# Patient Record
Sex: Female | Born: 1995 | Race: White | Hispanic: No | Marital: Single | Smoking: Never smoker
Health system: Southern US, Community
[De-identification: ages and names within clinical notes are randomized; demographics above are authoritative.]

## PROBLEM LIST (undated history)

## (undated) DIAGNOSIS — Z20828 Contact with and (suspected) exposure to other viral communicable diseases: Secondary | ICD-10-CM

---

## 2015-01-26 ENCOUNTER — Encounter: Payer: Self-pay | Admitting: Emergency Medicine

## 2015-01-26 ENCOUNTER — Emergency Department: Payer: 59

## 2015-01-26 ENCOUNTER — Emergency Department
Admission: EM | Admit: 2015-01-26 | Discharge: 2015-01-26 | Disposition: A | Discharge status: Home / Self Care | Payer: 59 | Attending: Emergency Medicine | Admitting: Emergency Medicine

## 2015-01-26 DIAGNOSIS — Z880 Allergy status to penicillin: Secondary | ICD-10-CM | POA: Insufficient documentation

## 2015-01-26 DIAGNOSIS — M5481 Occipital neuralgia: Secondary | ICD-10-CM | POA: Diagnosis not present

## 2015-01-26 DIAGNOSIS — R519 Headache, unspecified: Secondary | ICD-10-CM

## 2015-01-26 DIAGNOSIS — R51 Headache: Secondary | ICD-10-CM

## 2015-01-26 MED ORDER — METOCLOPRAMIDE HCL 5 MG/ML IJ SOLN
INTRAMUSCULAR | Status: AC
  Filled 2015-01-26: qty 2

## 2015-01-26 MED ORDER — PROMETHAZINE HCL 25 MG PO TABS: 25.0000 mg | ORAL_TABLET | Freq: Three times a day (TID) | ORAL | Status: AC | PRN

## 2015-01-26 MED ORDER — METOCLOPRAMIDE HCL 5 MG/ML IJ SOLN
10.0000 mg | Freq: Once | INTRAMUSCULAR | Status: AC
  Administered 2015-01-26: 10 mg via INTRAVENOUS
  Filled 2015-01-26: qty 2

## 2015-01-26 MED ORDER — KETOROLAC TROMETHAMINE 30 MG/ML IJ SOLN
30.0000 mg | Freq: Once | INTRAMUSCULAR | Status: AC
  Administered 2015-01-26: 30 mg via INTRAVENOUS

## 2015-01-26 MED ORDER — KETOROLAC TROMETHAMINE 30 MG/ML IJ SOLN
INTRAMUSCULAR | Status: AC
  Filled 2015-01-26: qty 1

## 2015-01-26 MED ORDER — SODIUM CHLORIDE 0.9 % IV BOLUS (SEPSIS)
1000.0000 mL | Freq: Once | INTRAVENOUS | Status: AC
  Administered 2015-01-26: 1000 mL via INTRAVENOUS

## 2015-01-26 NOTE — ED Provider Notes (Signed)
[facility] Emergency Department Provider Note REMINDER - THIS NOTE IS NOT A FINAL MEDICAL RECORD UNTIL IT IS SIGNED. UNTIL THEN, THE CONTENT BELOW MAY REFLECT INFORMATION FROM A DOCUMENTATION TEMPLATE, NOT THE ACTUAL PATIENT VISIT. ____________________________________________  Time seen: Approximately 5:35 PM  I have reviewed the triage vital signs and the nursing notes.   HISTORY  Chief Complaint Headache    HPI Patricia Miranda is a 19 y.o. female with no significant medical history.  Patient reports for about the last 4 days she's been having a moderate headache, generally worse when she is up and moving, and also notices it more when she is trying to focus her pain attention during class. She reports it is causing her some nausea. The headache is primarily located at the base of the skull and radiates up over the back of the scalp on each side. Described as a throbbing sensation. Light does make the headache slightly worse, but not significantly worse.  No vomiting. She has not had any fever. She denies neck pain, but does have sensation that the pain radiates out from the back of the base of the skull up. No injuries or trauma.  She does report that she had a CAT scan of her head when she was in junior high, but there is no notation of it being abnormal at that time. She did go to urgent care yesterday and her headache improved slightly with IM Toradol, but it still persists today.  No numbness or tingling. No weakness. The headache was mild in onset, and has seemed to persistently worsened or didn't improve yesterday but then returned again this morning.  Family history migraines in both her mother and father. No history of aneurysms in the family. She has had her meningitis vaccine.  History reviewed. No pertinent past medical history.  There are no active problems to display for this patient.   History reviewed. No pertinent past surgical  history.  Current Outpatient Rx  Name  Route  Sig  Dispense  Refill  . promethazine (PHENERGAN) 25 MG tablet   Oral   Take 1 tablet (25 mg total) by mouth every 8 (eight) hours as needed for nausea or vomiting.   30 tablet   0     Allergies Amoxicillin  No family history on file.  Social History Social History  Substance Use Topics  . Smoking status: Never Smoker   . Smokeless tobacco: None  . Alcohol Use: Yes    Review of Systems Constitutional: No fever/chills Eyes: No visual changes. ENT: No sore throat. Cardiovascular: Denies chest pain. Respiratory: Denies shortness of breath. Gastrointestinal: No abdominal pain.  No nausea, no vomiting.  No diarrhea.  No constipation. Genitourinary: Negative for dysuria. Musculoskeletal: Negative for back pain. Skin: Negative for rash. Neurological: Negative forfocal weakness or numbness.  Denies pregnancy.  No tick bites.  10-point ROS otherwise negative.  ____________________________________________   PHYSICAL EXAM:  VITAL SIGNS: ED Triage Vitals  Enc Vitals Group     BP 01/26/15 1505 113/87 mmHg     Pulse Rate 01/26/15 1505 97     Resp 01/26/15 1505 20     Temp 01/26/15 1505 99 F (37.2 C)     Temp Source 01/26/15 1505 Oral     SpO2 01/26/15 1505 99 %     Weight 01/26/15 1505 134 lb (60.782 kg)     Height 01/26/15 1505  (1.702 m)     Head Cir --  Peak Flow --      Pain Score 01/26/15 1508 8     Pain Loc --      Pain Edu? --      Excl. in GC? --    Constitutional: Alert and oriented. Well appearing and in no acute distress. Eyes: Conjunctivae are normal. PERRL. EOMI. Head: Atraumatic. Nose: No congestion/rhinnorhea. Mouth/Throat: Mucous membranes are moist.  Oropharynx non-erythematous. Neck: No stridor.  No meningismus. Midline no cervical spine tenderness. Jolt accentuation testing is normal. No temporal artery tenderness. Normal temporal artery pulsation bilateral Cardiovascular: Normal  rate, regular rhythm. Grossly normal heart sounds.  Good peripheral circulation. Respiratory: Normal respiratory effort.  No retractions. Lungs CTAB. Gastrointestinal: Soft and nontender. No distention. No abdominal bruits. No CVA tenderness. Musculoskeletal: No lower extremity tenderness nor edema.  No joint effusions. Neurologic:  Normal speech and language. No gross focal neurologic deficits are appreciated.   The patient has no pronator drift. The patient has normal cranial nerve exam. Extraocular movements are normal. Visual fields are normal. Patient has 5 out of 5 strength in all extremities. There is no numbness or gross, acute sensory abnormality in the extremities bilaterally. No speech disturbance. No dysarthria. No aphasia. No ataxia. Patient speaking in full and clear sentences.   Skin:  Skin is warm, dry and intact. No rash noted. Psychiatric: Mood and affect are normal. Speech and behavior are normal.  ____________________________________________   LABS (all labs ordered are listed, but only abnormal results are displayed)  Labs Reviewed - No data to display ____________________________________________  EKG   ____________________________________________  RADIOLOGY  CT Head Wo Contrast (Final result) Result time: 01/26/15 17:56:19   Final result by Rad Results In Interface (01/26/15 17:56:19)   Narrative:   CLINICAL DATA: Headache for 4 days. Worse when reading and trying to focus.  EXAM: CT HEAD WITHOUT CONTRAST  TECHNIQUE: Contiguous axial images were obtained from the base of the skull through the vertex without contrast.  COMPARISON: None  FINDINGS: Normal appearance of the intracranial structures. No evidence for acute hemorrhage, mass lesion, midline shift, hydrocephalus or large infarct. No acute bony abnormality. The visualized sinuses are clear.  IMPRESSION: Negative head CT.     ____________________________________________   PROCEDURES  Procedure(s) performed: None  Critical Care performed: No  ____________________________________________   INITIAL IMPRESSION / ASSESSMENT AND PLAN / ED COURSE  Pertinent labs & imaging results that were available during my care of the patient were reviewed by me and considered in my medical decision making (see chart for details).  Headache, rather insidious onset while at the fair. Has come and gone, but slightly worse for last 2 days. Did improve after being seen in urgent care, but is recurred. At this point she is afebrile, she has no infectious symptoms and no meningismus. Jolt accentuation is negative him and I find the likelihood of meningitis to be extremely unlikely. We did discuss the risks and benefits of spinal tap, and after discussion of the risks and benefits we will not provide a spinal tap. I am in agreement, I think at this point that the risk of Location from spinal tap and the possibility of actually worsening or headache is higher than the likelihood of meningitis based on critical history and exam. Certainly, I discussed with she develops a fever or neck pain or stiffness or worsening of her headache she will need to return to emergency room right away, and she is quite agreeable.  No history suggesting aneurysm. Based on radiation of  pain I suspect this may be due to an occipital type headache, migraine, or stress headache. No evidence of neurologic abnormality. We will obtain CT head, as the patient has not had any imaging in recent years and we do wish to exclude any large mass lesion.  ----------------------------------------- 7:19 PM on 01/26/2015 -----------------------------------------  Patient awake alert no distress. States her headache is completely resolved. She is well-appearing, and I discussed care for return precautions especially if she develop any fever or neck stiffness she'll return  emergency room right away. At this point, I suspect this is occipital headache versus possible migraine or strong family history. No symptomatology to support diagnosis of aneurysm, temporal arteritis, meningitis.  Discharge home, much improved.  ____________________________________________   FINAL CLINICAL IMPRESSION(S) / ED DIAGNOSES  Final diagnoses:  Headache, occipital      Sharyn Creamer, MD 01/26/15 1920

## 2015-01-26 NOTE — ED Notes (Signed)
Pt with headache for four days worse when reading and tyring to focus to see. With some nausea.

## 2015-01-26 NOTE — Discharge Instructions (Signed)
You have been seen in the Emergency Department (ED) for a headache.  Please use Tylenol or Motrin as needed for symptoms, but only as written on the box.  As we have discussed, please follow up with your primary care doctor as soon as possible regarding todays Emergency Department (ED) visit and your headache symptoms.    Call your doctor or return to the ED if you have any FEVER or stiff neck, worsening headache, sudden and severe headache, confusion, slurred speech, facial droop, weakness or numbness in any arm or leg, extreme fatigue, vision problems, or other symptoms that concern you.   General Headache Without Cause A headache is pain or discomfort felt around the head or neck area. The specific cause of a headache may not be found. There are many causes and types of headaches. A few common ones are:  Tension headaches.  Migraine headaches.  Cluster headaches.  Chronic daily headaches. HOME CARE INSTRUCTIONS  Watch your condition for any changes. Take these steps to help with your condition: Managing Pain  Take over-the-counter and prescription medicines only as told by your health care provider.  Lie down in a dark, quiet room when you have a headache.  If directed, apply ice to the head and neck area:  Put ice in a plastic bag.  Place a towel between your skin and the bag.  Leave the ice on for 20 minutes, 2-3 times per day.  Use a heating pad or hot shower to apply heat to the head and neck area as told by your health care provider.  Keep lights dim if bright lights bother you or make your headaches worse. Eating and Drinking  Eat meals on a regular schedule.  Limit alcohol use.  Decrease the amount of caffeine you drink, or stop drinking caffeine. General Instructions  Keep all follow-up visits as told by your health care provider. This is important.  Keep a headache journal to help find out what may trigger your headaches. For example, write down:  What you  eat and drink.  How much sleep you get.  Any change to your diet or medicines.  Try massage or other relaxation techniques.  Limit stress.  Sit up straight, and do not tense your muscles.  Do not use tobacco products, including cigarettes, chewing tobacco, or e-cigarettes. If you need help quitting, ask your health care provider.  Exercise regularly as told by your health care provider.  Sleep on a regular schedule. Get 7-9 hours of sleep, or the amount recommended by your health care provider. SEEK MEDICAL CARE IF:   Your symptoms are not helped by medicine.  You have a headache that is different from the usual headache.  You have nausea or you vomit.  You have a fever. SEEK IMMEDIATE MEDICAL CARE IF:   Your headache becomes severe.  You have repeated vomiting.  You have a stiff neck.  You have a loss of vision.  You have problems with speech.  You have pain in the eye or ear.  You have muscular weakness or loss of muscle control.  You lose your balance or have trouble walking.  You feel faint or pass out.  You have confusion.   This information is not intended to replace advice given to you by your health care provider. Make sure you discuss any questions you have with your health care provider.   Document Released: 03/18/2005 Document Revised: 12/07/2014 Document Reviewed: 07/11/2014 Elsevier Interactive Patient Education [employer]2016 [employer]

## 2015-01-28 ENCOUNTER — Emergency Department: Payer: 59

## 2015-01-28 ENCOUNTER — Encounter: Payer: Self-pay | Admitting: Emergency Medicine

## 2015-01-28 ENCOUNTER — Emergency Department
Admission: EM | Admit: 2015-01-28 | Discharge: 2015-01-28 | Disposition: A | Discharge status: Home / Self Care | Payer: 59 | Attending: Emergency Medicine | Admitting: Emergency Medicine

## 2015-01-28 DIAGNOSIS — R519 Headache, unspecified: Secondary | ICD-10-CM

## 2015-01-28 DIAGNOSIS — R509 Fever, unspecified: Secondary | ICD-10-CM | POA: Insufficient documentation

## 2015-01-28 DIAGNOSIS — R51 Headache: Secondary | ICD-10-CM | POA: Insufficient documentation

## 2015-01-28 DIAGNOSIS — Z880 Allergy status to penicillin: Secondary | ICD-10-CM | POA: Diagnosis not present

## 2015-01-28 HISTORY — DX: Contact with and (suspected) exposure to other viral communicable diseases: Z20.828

## 2015-01-28 LAB — COMPREHENSIVE METABOLIC PANEL
ALBUMIN: 4.3 g/dL (ref 3.5–5.0)
ALK PHOS: 46 U/L (ref 38–126)
ALT: 63 U/L — ABNORMAL HIGH (ref 14–54)
ANION GAP: 4 — AB (ref 5–15)
AST: 72 U/L — ABNORMAL HIGH (ref 15–41)
BILIRUBIN TOTAL: 0.8 mg/dL (ref 0.3–1.2)
BUN: 10 mg/dL (ref 6–20)
CALCIUM: 9.3 mg/dL (ref 8.9–10.3)
CO2: 28 mmol/L (ref 22–32)
Chloride: 106 mmol/L (ref 101–111)
Creatinine, Ser: 0.82 mg/dL (ref 0.44–1.00)
GFR calc Af Amer: >60 mL/min (ref >60)
GLUCOSE: 87 mg/dL (ref 65–99)
Potassium: 3.9 mmol/L (ref 3.5–5.1)
Sodium: 138 mmol/L (ref 135–145)
TOTAL PROTEIN: 7.5 g/dL (ref 6.5–8.1)

## 2015-01-28 LAB — CBC WITH DIFFERENTIAL/PLATELET
BASOS PCT: 0 %
Basophils Absolute: 0 10*3/uL (ref 0–0.1)
Eosinophils Absolute: 0.1 10*3/uL (ref 0–0.7)
Eosinophils Relative: 1 %
HEMATOCRIT: 44 % (ref 35.0–47.0)
HEMOGLOBIN: 14.6 g/dL (ref 12.0–16.0)
LYMPHS ABS: 1.3 10*3/uL (ref 1.0–3.6)
LYMPHS PCT: 16 %
MCH: 29.5 pg (ref 26.0–34.0)
MCHC: 33.2 g/dL (ref 32.0–36.0)
MCV: 89.1 fL (ref 80.0–100.0)
MONO ABS: 0.5 10*3/uL (ref 0.2–0.9)
MONOS PCT: 7 %
NEUTROS ABS: 6.1 10*3/uL (ref 1.4–6.5)
NEUTROS PCT: 76 %
Platelets: 158 10*3/uL (ref 150–440)
RBC: 4.94 MIL/uL (ref 3.80–5.20)
RDW: 12.8 % (ref 11.5–14.5)
WBC: 8 10*3/uL (ref 3.6–11.0)

## 2015-01-28 MED ORDER — DIAZEPAM 2 MG PO TABS
2.0000 mg | ORAL_TABLET | Freq: Four times a day (QID) | ORAL | Status: AC | PRN
Start: 2015-01-28 — End: 2016-01-28

## 2015-01-28 MED ORDER — DIAZEPAM 2 MG PO TABS
2.0000 mg | ORAL_TABLET | Freq: Once | ORAL | Status: AC
  Administered 2015-01-28: 2 mg via ORAL
  Filled 2015-01-28: qty 1

## 2015-01-28 MED ORDER — KETOROLAC TROMETHAMINE 30 MG/ML IJ SOLN
30.0000 mg | Freq: Once | INTRAMUSCULAR | Status: AC
  Administered 2015-01-28: 30 mg via INTRAVENOUS
  Filled 2015-01-28: qty 1

## 2015-01-28 MED ORDER — GADOBENATE DIMEGLUMINE 529 MG/ML IV SOLN
15.0000 mL | Freq: Once | INTRAVENOUS | Status: AC | PRN
  Administered 2015-01-28: 12 mL via INTRAVENOUS

## 2015-01-28 MED ORDER — DEXAMETHASONE 1 MG/ML PO CONC: 10.0000 mg | Freq: Once | ORAL | Status: DC

## 2015-01-28 MED ORDER — MAGNESIUM SULFATE 2 GM/50ML IV SOLN
2.0000 g | Freq: Once | INTRAVENOUS | Status: AC
Start: 2015-01-28 — End: 2015-01-28
  Administered 2015-01-28: 2 g via INTRAVENOUS
  Filled 2015-01-28: qty 50

## 2015-01-28 MED ORDER — PROCHLORPERAZINE EDISYLATE 5 MG/ML IJ SOLN
10.0000 mg | Freq: Four times a day (QID) | INTRAMUSCULAR | Status: DC | PRN
  Administered 2015-01-28: 10 mg via INTRAVENOUS
  Filled 2015-01-28: qty 2

## 2015-01-28 MED ORDER — DEXAMETHASONE SODIUM PHOSPHATE 10 MG/ML IJ SOLN
10.0000 mg | Freq: Once | INTRAMUSCULAR | Status: AC
  Administered 2015-01-28: 10 mg via INTRAVENOUS
  Filled 2015-01-28: qty 1

## 2015-01-28 MED ORDER — SODIUM CHLORIDE 0.9 % IV BOLUS (SEPSIS)
1000.0000 mL | Freq: Once | INTRAVENOUS | Status: AC
  Administered 2015-01-28: 1000 mL via INTRAVENOUS

## 2015-01-28 NOTE — Discharge Instructions (Signed)
No certain cause was found for your headache, however your exam and evaluation are reassuring here in the emergency department today. We discussed you may use over-the-counter ibuprofen 600 mg every 8 hours as needed for headache. This can be combined with your Phenergan prescription.  I am adding a new prescription called Valium which is a muscle relaxer, and may help if tension is component of this headache. Do not combine with other sedating medications including Phenergan, Benadryl, or alcohol.  We discussed magnesium supplementation.  Slow-release preparation such as Slow-Mag or Mag64 would be best.  1-2 tablets twice daily until headache resolved.  If no slow release preparation, you may use Magnesium Oxide which is an immediate release preparation,  up to every 6 hours.  Return to the emergency department for any worsening condition including worsening headache, any confusion or altered mental status, new weakness or numbness, seizure activity, neck stiffness, fever, skin rash, or any other symptoms that are concerning to you.   General Headache Without Cause A headache is pain or discomfort felt around the head or neck area. There are many causes and types of headaches. In some cases, the cause may not be found.  HOME CARE  Managing Pain  Take over-the-counter and prescription medicines only as told by your doctor.  Lie down in a dark, quiet room when you have a headache.  If directed, apply ice to the head and neck area:  Put ice in a plastic bag.  Place a towel between your skin and the bag.  Leave the ice on for 20 minutes, 2-3 times per day.  Use a heating pad or hot shower to apply heat to the head and neck area as told by your doctor.  Keep lights dim if bright lights bother you or make your headaches worse. Eating and Drinking  Eat meals on a regular schedule.  Lessen how much alcohol you drink.  Lessen how much caffeine you drink, or stop drinking  caffeine. General Instructions  Keep all follow-up visits as told by your doctor. This is important.  Keep a journal to find out if certain things bring on headaches. For example, write down:  What you eat and drink.  How much sleep you get.  Any change to your diet or medicines.  Relax by getting a massage or doing other relaxing activities.  Lessen stress.  Sit up straight. Do not tighten (tense) your muscles.  Do not use tobacco products. This includes cigarettes, chewing tobacco, or e-cigarettes. If you need help quitting, ask your doctor.  Exercise regularly as told by your doctor.  Get enough sleep. This often means 7-9 hours of sleep. GET HELP IF:  Your symptoms are not helped by medicine.  You have a headache that feels different than the other headaches.  You feel sick to your stomach (nauseous) or you throw up (vomit).  You have a fever. GET HELP RIGHT AWAY IF:   Your headache becomes really bad.  You keep throwing up.  You have a stiff neck.  You have trouble seeing.  You have trouble speaking.  You have pain in the eye or ear.  Your muscles are weak or you lose muscle control.  You lose your balance or have trouble walking.  You feel like you will pass out (faint) or you pass out.  You have confusion.   This information is not intended to replace advice given to you by your health care provider. Make sure you discuss any questions you have  with your health care provider.   Document Released: 12/26/2007 Document Revised: 12/07/2014 Document Reviewed: 07/11/2014 Elsevier Interactive Patient Education [employer] Elsevier Inc.

## 2015-01-28 NOTE — ED Notes (Signed)
Patient transported to MRI. NT with patient.

## 2015-01-28 NOTE — ED Provider Notes (Signed)
[facility]  I accepted care from Dr. Derrill KayGoodman ____________________________________________    LABS (pertinent positives/negatives)  CBC within normal limits. Normal white blood cell count with no left shift. Normal hemoglobin. White count 158 Con transit metabolic panel significant for AST 72 and a LT 63 and otherwise all within normal limits   ____________________________________________    RADIOLOGY All xrays were viewed by me. Imaging interpreted by radiologist.  MRI and MRV brain:  FINDINGS: The patient was unable to remain motionless for the exam. Small or subtle lesions could be overlooked. MRI HEAD FINDINGS  No evidence for acute infarction, hemorrhage, mass lesion, hydrocephalus, or extra-axial fluid. Normal cerebral volume. No white matter disease. No midline abnormality. No osseous findings. Post infusion, no abnormal enhancement of the brain or meninges. Moderate nasopharyngeal adenoidal hypertrophy, otherwise unremarkable extracranial soft tissues.  MRA HEAD FINDINGS   FINDINGS: The patient was unable to remain motionless for the exam. Small or subtle lesions could be overlooked. MRI HEAD FINDINGS  No evidence for acute infarction, hemorrhage, mass lesion, hydrocephalus, or extra-axial fluid. Normal cerebral volume. No white matter disease. No midline abnormality. No osseous findings. Post infusion, no abnormal enhancement of the brain or meninges. Moderate nasopharyngeal adenoidal hypertrophy, otherwise unremarkable extracranial soft tissues.  MRA HEAD FINDINGS  The internal carotid arteries are widely patent. The basilar artery is widely patent. Both vertebrals contribute to basilar formation, LEFT dominant. No intracranial stenosis or aneurysm.  MRV head FINDINGS  All major dural venous sinuses are patent. Symmetric venous flow. Within limits of detection on MRV, no cortical venous thrombosis.  IMPRESSION: Negative  exam.   ____________________________________________   PROCEDURES  Procedure(s) performed: None  Critical Care performed: None  ____________________________________________   INITIAL IMPRESSION / ASSESSMENT AND PLAN / ED COURSE  CONSULTATIONS: None  Pertinent labs & imaging results that were available during my care of the patient were reviewed by me and considered in my medical decision making (see chart for details).  The patient and her mother filled me in on the fact that she's had a headache now since Monday, with really minimal relief despite treatment here in the emergency department previously and today. She is currently still having a headache of 7 out of 10. She does have some tenderness on palpation of the trapezius muscles bilaterally and at the insertion at the base of the skull. There is no neck stiffness. She is not really had any other viral symptoms, but viral meningitis is still on the differential diagnosis.  Clinically I do not think she has bacterial meningitis. I discussed lumbar puncture with mom and patient, and they did not want to proceed with lumbar puncture.  Her MRI/MRA/MRV are negative and I discussed this result with the patient and her mom.  I am adding Toradol today for persistent headache, and they stated this has not worked in the past. I am adding magnesium IV. I'm also adding a dose of Decadron as well as a by mouth tablet of Valium given the muscular discomfort of the trapezius.  We discussed environmental factors, it does not appear that she has been exposed to any chemicals, or mold.  They were given follow-up information for Dr. Malvin JohnsPotter, neurology on Thursday, and had not made an appointment yet. They will try again to call the office on Monday. Mom is also going to check with other neurology practice is where she can try to get her daughter in the quickest.  At 7:10 PM, patient was feeling somewhat improved, but  lying flat. I discussed with  them admission for intractable headache versus discharge home. I don't see an acute indication or need for hospitalization, and they did express interest in trying to go home. We discussed returning for any worsening condition.  Patient / Family / Caregiver informed of clinical course, medical decision-making process, and agree with plan.   I discussed return precautions, follow-up instructions, and discharged instructions with patient and/or family.  ____________________________________________   FINAL CLINICAL IMPRESSION(S) / ED DIAGNOSES  Final diagnoses:  Headache     FOLLOW UP   Referred to: Dr. Malvin Johns, neurology. [facility] for primary care  Governor Rooks, MD 01/28/15 1911

## 2015-01-28 NOTE — ED Provider Notes (Signed)
[facility] Emergency Department Provider Note    ____________________________________________  Time seen: 1300  I have reviewed the triage vital signs and the nursing notes.   HISTORY  Chief Complaint Headache and Fever   History limited by: Not Limited   HPI Patricia Shutterslexandra Wyre is a 19 y.o. female who presents to the emergency department today with continued headache. The patient states that this headache is now been going on for almost one week. The headache is located bilaterally and extends from her neck into her frontal region. The patient states the headache is worse when she sits up. She denies having similar headaches in the past. She has been seen now twice for this headache and with only temporary relief with headache cocktails given by providers. She states she is having low-grade fever. She denies being on any birth control or smoking. No history of blood clots in the family.   Past Medical History  Diagnosis Date  . 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   History reviewed. No pertinent past surgical history.  Current Outpatient Rx  Name  Route  Sig  Dispense  Refill  . promethazine (PHENERGAN) 25 MG tablet   Oral   Take 1 tablet (25 mg total) by mouth every 8 (eight) hours as needed for nausea or vomiting.   30 tablet   0     Allergies Amoxicillin  No family history on file.  Social History Social History  Substance Use Topics  . Smoking status: Never Smoker   . Smokeless tobacco: None  . Alcohol Use: Yes     Comment: occas.     Review of Systems  Constitutional: Negative for fever. Cardiovascular: Negative for chest pain. Respiratory: Negative for shortness of breath. Gastrointestinal: Negative for abdominal pain, vomiting and diarrhea. Genitourinary: Negative for dysuria. Musculoskeletal: Negative for back pain. Skin: Negative for rash. Neurological: Positive for  headache   10-point ROS otherwise negative.  ____________________________________________   PHYSICAL EXAM:  VITAL SIGNS: ED Triage Vitals  Enc Vitals Group     BP 01/28/15 1149 110/80 mmHg     Pulse Rate 01/28/15 1149 81     Resp 01/28/15 1149 18     Temp 01/28/15 1149 98.4 F (36.9 C)     Temp Source 01/28/15 1149 Oral     SpO2 01/28/15 1149 100 %     Weight 01/28/15 1149 134 lb (60.782 kg)     Height 01/28/15 1149 5\' 7"  (1.702 m)     Head Cir --      Peak Flow --      Pain Score 01/28/15 1150 7   Constitutional: Alert and oriented. Well appearing and in no distress lying in the bed however upon sitting up she appears more uncomfortable. Eyes: Conjunctivae are normal. PERRL. Normal extraocular movements. ENT   Head: Normocephalic and atraumatic.   Nose: No congestion/rhinnorhea.   Mouth/Throat: Mucous membranes are moist.   Neck: No stridor. Hematological/Lymphatic/Immunilogical: No cervical lymphadenopathy. Cardiovascular: Normal rate, regular rhythm.  No murmurs, rubs, or gallops. Respiratory: Normal respiratory effort without tachypnea nor retractions. Breath sounds are clear and equal bilaterally. No wheezes/rales/rhonchi. Gastrointestinal: Soft and nontender. No distention. There is no CVA tenderness. Genitourinary: Deferred Musculoskeletal: Normal range of motion in all extremities. No joint effusions.  No lower extremity tenderness nor edema. Neurologic:  Normal speech and language. No gross focal neurologic deficits are appreciated. Speech is normal.  Skin:  Skin is warm,  dry and intact. No rash noted. Psychiatric: Mood and affect are normal. Speech and behavior are normal. Patient exhibits appropriate insight and judgment.  ____________________________________________    LABS (pertinent positives/negatives)  Labs Reviewed  COMPREHENSIVE METABOLIC PANEL - Abnormal; Notable for the following:    AST 72 (*)    ALT 63 (*)    Anion gap 4 (*)     All other components within normal limits  CBC WITH DIFFERENTIAL/PLATELET     ____________________________________________   EKG  None  ____________________________________________    RADIOLOGY  MRI pending   ____________________________________________   PROCEDURES  Procedure(s) performed: None  Critical Care performed: No  ____________________________________________   INITIAL IMPRESSION / ASSESSMENT AND PLAN / ED COURSE  Pertinent labs & imaging results that were available during my care of the patient were reviewed by me and considered in my medical decision making (see chart for details).  Patient presents to the emergency department today with concerns for continued headache. She has been seen now 2 times previously. She did have a negative CT scan. On exam patient without any signs of meningitis. Full range and painless motion of the neck. At this point I doubt bacterial meningitis given clinical findings as well as lack of fever. Given the patient has persistent headache will get an MRI including MRV to rule out venous sinus thrombosis as well as any other concerning etiology. Will give medication.  ____________________________________________   FINAL CLINICAL IMPRESSION(S) / ED DIAGNOSES  Final diagnoses:  Headache     Phineas Semen, MD 01/29/15 [phone]

## 2015-01-28 NOTE — ED Notes (Signed)
Patient returned from MRI.

## 2015-01-28 NOTE — ED Notes (Signed)
Pt reports since Thursday fever, light sensitivity, stiffness in neck and pain in upper back and shoulder. Pt also reports headache since Monday. Pt reports 99.4 temp taken in ear. Last night

## 2017-04-02 IMAGING — MR MR MRA HEAD W/O CM
1 series · 26 of 48 positions shown · IV contrast (multihance)
Comparison: CT head 01/26/2015

CLINICAL DATA: Headache for 4 days.  Neck stiffness.  Fever.

EXAM:
MRI HEAD WITHOUT AND WITH CONTRAST AND MRA HEAD WITHOUT CONTRAST AND
MRV HEAD WITHOUT CONTRAST
TECHNIQUE: Multiplanar, multiecho pulse sequences of the brain and surrounding
structures were obtained without and with intravenous contrast.
Angiographic images of the head were obtained using MRA technique
without contrast. Multiplanar, multiecho pulse sequences of the
venous sinuses were obtained without contrast
CONTRAST:  12mL MULTIHANCE GADOBENATE DIMEGLUMINE 529 MG/ML IV SOLN

[Series 3: TOF · axial · 0.8mm · 0.39mm/px · z∈[-66,+24]mm · 26 of 120 slices shown]
[im 1/120]
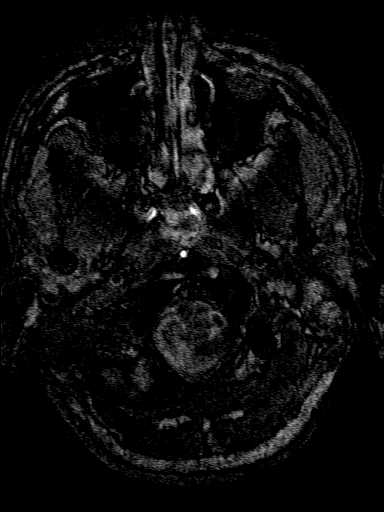
[im 3/120]
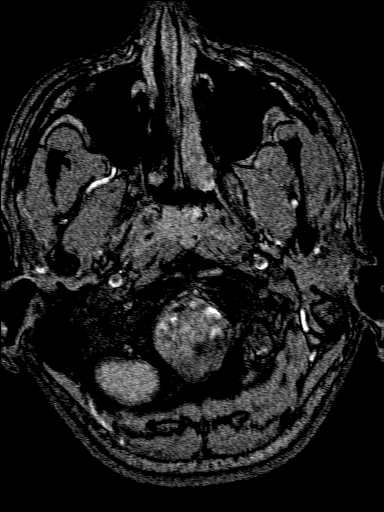
[im 6/120]
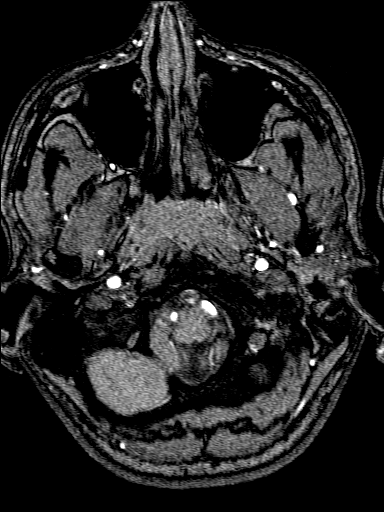
[im 8/120]
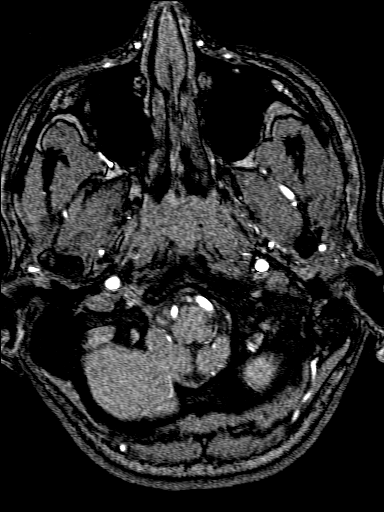
[im 11/120]
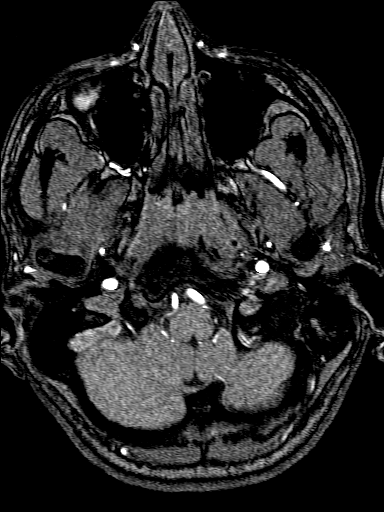
[im 13/120]
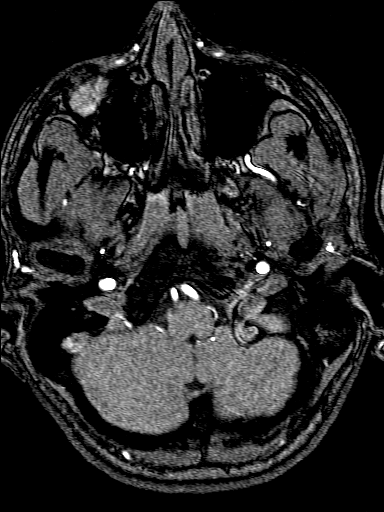
[im 16/120]
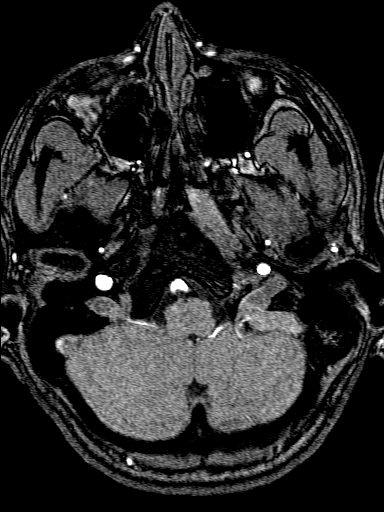
[im 18/120]
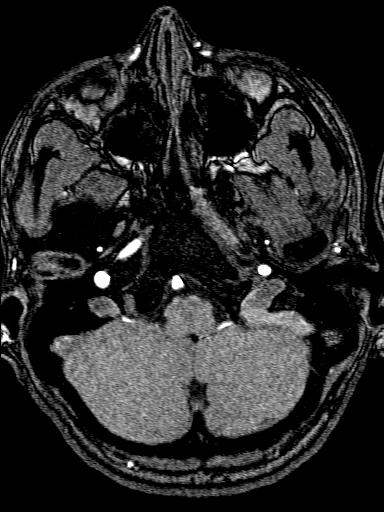
[im 21/120]
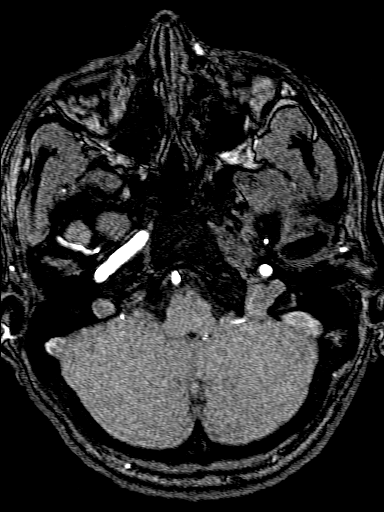
[im 23/120]
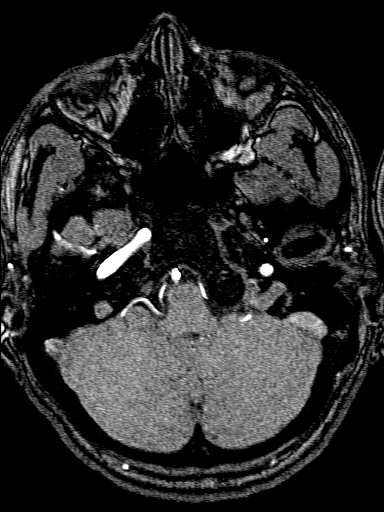
[im 26/120]
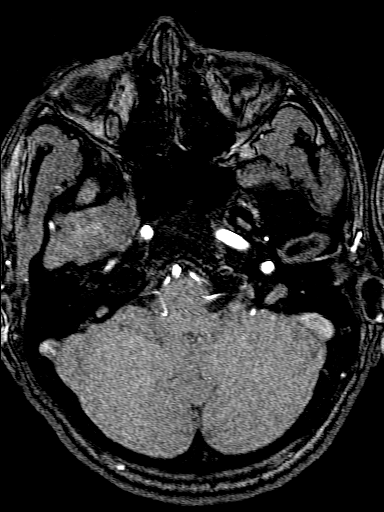
[im 28/120]
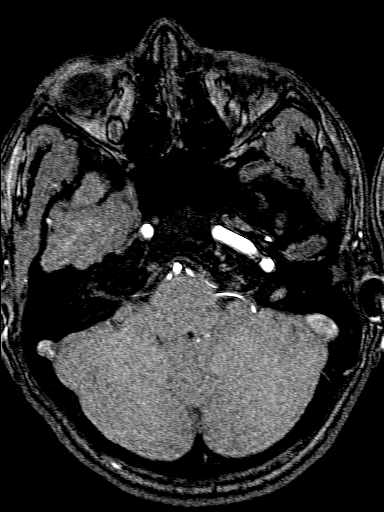
[im 31/120]
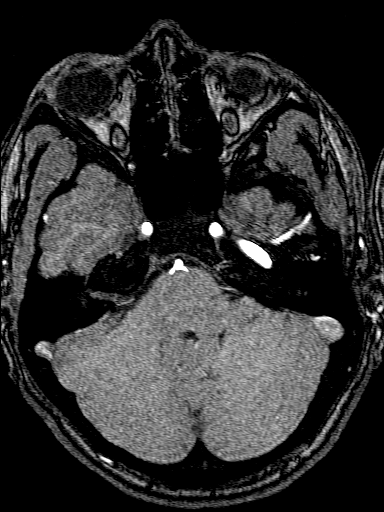
[im 33/120]
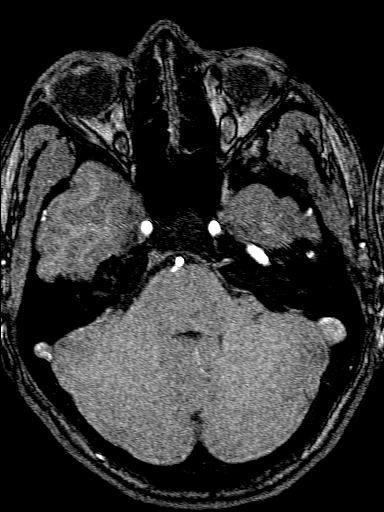
[im 36/120]
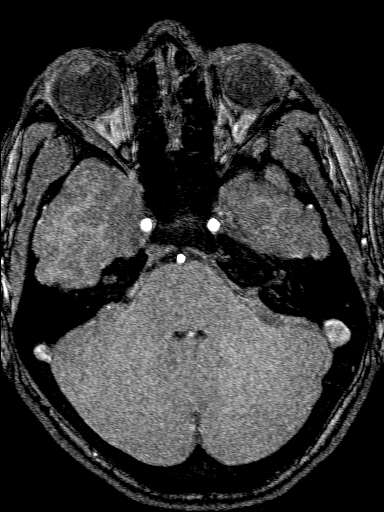
[im 38/120]
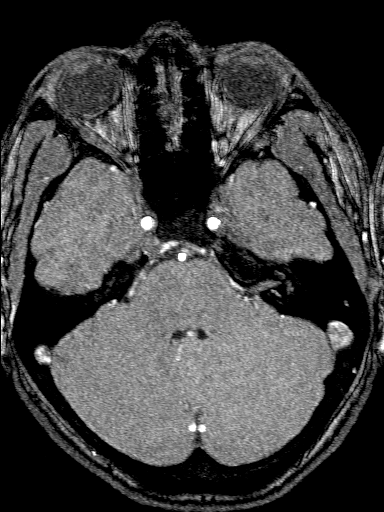
[im 41/120]
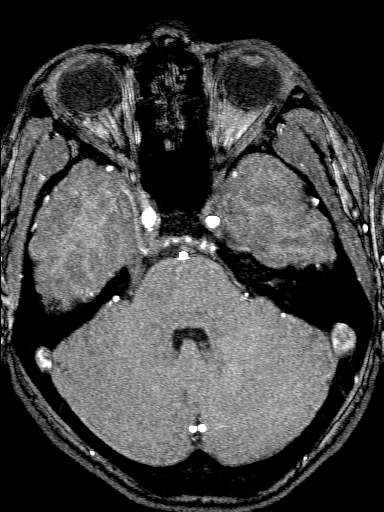
[im 44/120]
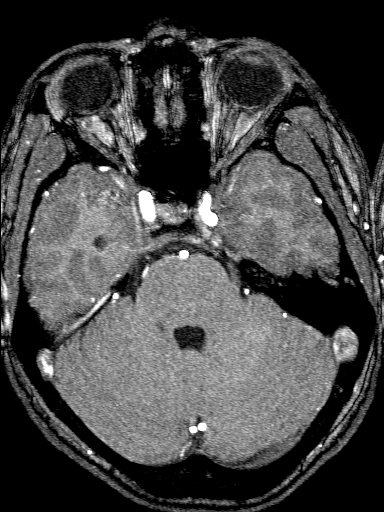
[im 46/120]
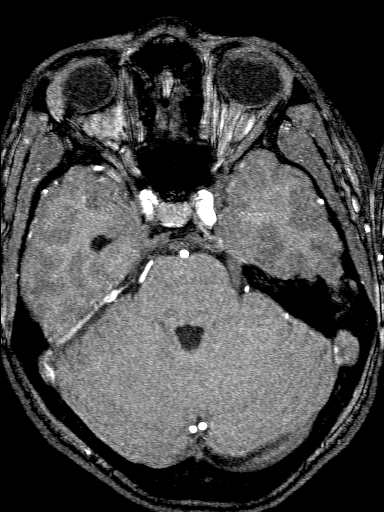
[im 54/120]
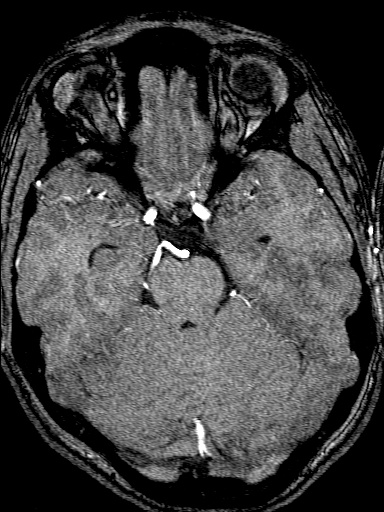
[im 61/120]
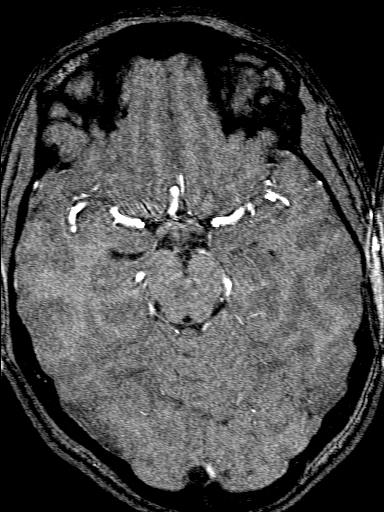
[im 69/120]
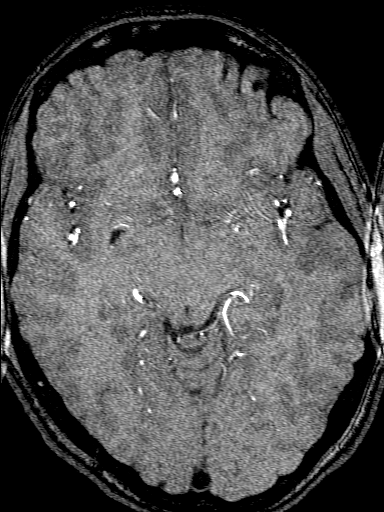
[im 84/120]
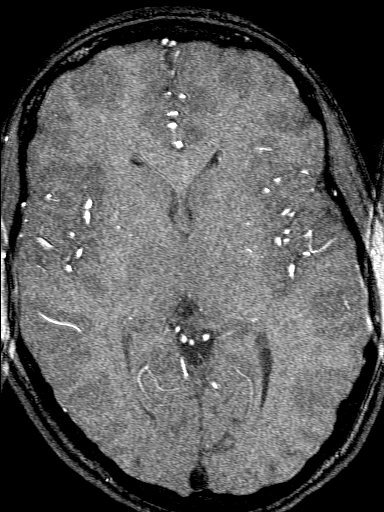
[im 99/120]
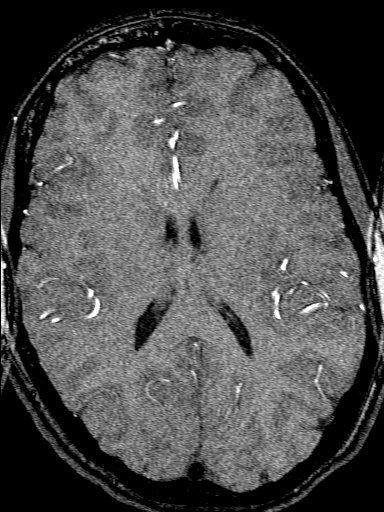
[im 102/120]
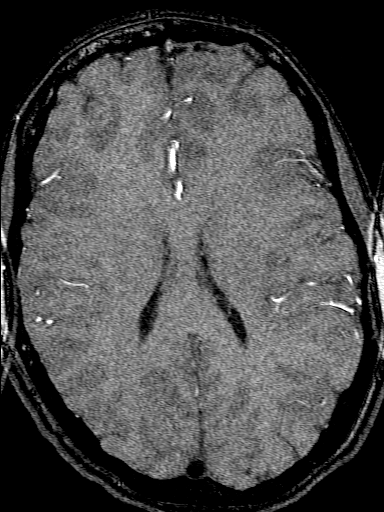
[im 114/120]
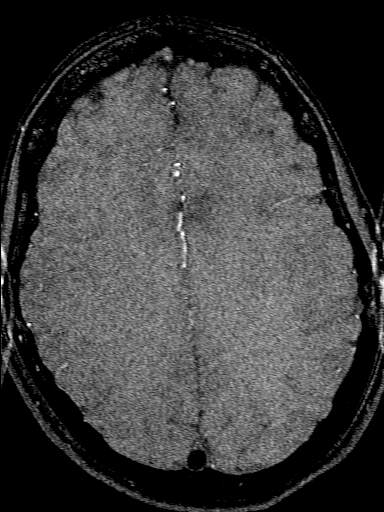

[26 of 48 positions shown; findings below may reference images not displayed]

FINDINGS: The patient was unable to remain motionless for the exam. Small or
subtle lesions could be overlooked. MRI HEAD FINDINGS

No evidence for acute infarction, hemorrhage, mass lesion,
hydrocephalus, or extra-axial fluid. Normal cerebral volume. No
white matter disease. No midline abnormality. No osseous findings.
Post infusion, no abnormal enhancement of the brain or meninges.
Moderate nasopharyngeal adenoidal hypertrophy, otherwise
unremarkable extracranial soft tissues.

MRA HEAD FINDINGS

The internal carotid arteries are widely patent. The basilar artery
is widely patent. Both vertebrals contribute to basilar formation,
LEFT dominant. No intracranial stenosis or aneurysm.

MRV head FINDINGS

All major dural venous sinuses are patent. Symmetric venous flow.
Within limits of detection on MRV, no cortical venous thrombosis.
IMPRESSION: Negative exam.
